# Patient Record
Sex: Female | Born: 1995 | Race: Black or African American | Hispanic: No | Marital: Single | Smoking: Never smoker
Health system: Southern US, Community
[De-identification: ages and names within clinical notes are randomized; demographics above are authoritative.]

## PROBLEM LIST (undated history)

## (undated) ENCOUNTER — Inpatient Hospital Stay (HOSPITAL_COMMUNITY): Payer: Self-pay

---

## 2020-02-02 ENCOUNTER — Encounter (HOSPITAL_COMMUNITY): Payer: Self-pay | Admitting: Emergency Medicine

## 2020-02-02 ENCOUNTER — Ambulatory Visit (HOSPITAL_COMMUNITY): Admission: EM | Admit: 2020-02-02 | Discharge: 2020-02-02 | Discharge status: Against Medical Advice | Payer: Self-pay

## 2020-02-02 ENCOUNTER — Other Ambulatory Visit: Payer: Self-pay

## 2020-02-02 ENCOUNTER — Emergency Department (HOSPITAL_COMMUNITY): Payer: 59

## 2020-02-02 ENCOUNTER — Emergency Department (HOSPITAL_COMMUNITY)
Admission: EM | Admit: 2020-02-02 | Discharge: 2020-02-02 | Disposition: A | Discharge status: Home / Self Care | Payer: 59 | Attending: Emergency Medicine | Admitting: Emergency Medicine

## 2020-02-02 DIAGNOSIS — R42 Dizziness and giddiness: Secondary | ICD-10-CM | POA: Diagnosis present

## 2020-02-02 LAB — CBC WITH DIFFERENTIAL/PLATELET
Abs Immature Granulocytes: 0.03 10*3/uL (ref 0.00–0.07)
Basophils Absolute: 0.1 10*3/uL (ref 0.0–0.1)
Basophils Relative: 1 %
Eosinophils Absolute: 0.4 10*3/uL (ref 0.0–0.5)
Eosinophils Relative: 4 %
HCT: 40.4 % (ref 36.0–46.0)
Hemoglobin: 12.1 g/dL (ref 12.0–15.0)
Immature Granulocytes: 0 %
Lymphocytes Relative: 28 %
Lymphs Abs: 3.5 10*3/uL (ref 0.7–4.0)
MCH: 23.3 pg — ABNORMAL LOW (ref 26.0–34.0)
MCHC: 30 g/dL (ref 30.0–36.0)
MCV: 77.7 fL — ABNORMAL LOW (ref 80.0–100.0)
Monocytes Absolute: 1 10*3/uL (ref 0.1–1.0)
Monocytes Relative: 8 %
Neutro Abs: 7.2 10*3/uL (ref 1.7–7.7)
Neutrophils Relative %: 59 %
Platelets: 369 10*3/uL (ref 150–400)
RBC: 5.2 MIL/uL — ABNORMAL HIGH (ref 3.87–5.11)
RDW: 18.7 % — ABNORMAL HIGH (ref 11.5–15.5)
WBC: 12.1 10*3/uL — ABNORMAL HIGH (ref 4.0–10.5)
nRBC: 0 % (ref 0.0–0.2)

## 2020-02-02 LAB — COMPREHENSIVE METABOLIC PANEL
ALT: 13 U/L (ref 0–44)
AST: 22 U/L (ref 15–41)
Albumin: 4.4 g/dL (ref 3.5–5.0)
Alkaline Phosphatase: 37 U/L — ABNORMAL LOW (ref 38–126)
Anion gap: 9 (ref 5–15)
BUN: 10 mg/dL (ref 6–20)
CO2: 24 mmol/L (ref 22–32)
Calcium: 9.9 mg/dL (ref 8.9–10.3)
Chloride: 105 mmol/L (ref 98–111)
Creatinine, Ser: 0.79 mg/dL (ref 0.44–1.00)
GFR calc Af Amer: >60 mL/min (ref >60)
GFR calc non Af Amer: >60 mL/min (ref >60)
Glucose, Bld: 86 mg/dL (ref 70–99)
Potassium: 4.2 mmol/L (ref 3.5–5.1)
Sodium: 138 mmol/L (ref 135–145)
Total Bilirubin: 0.6 mg/dL (ref 0.3–1.2)
Total Protein: 7.2 g/dL (ref 6.5–8.1)

## 2020-02-02 LAB — URINALYSIS, ROUTINE W REFLEX MICROSCOPIC
Bilirubin Urine: NEGATIVE
Glucose, UA: NEGATIVE mg/dL
Ketones, ur: NEGATIVE mg/dL
Leukocytes,Ua: NEGATIVE
Nitrite: POSITIVE — AB
Protein, ur: NEGATIVE mg/dL
Specific Gravity, Urine: 1.012 (ref 1.005–1.030)
pH: 7 (ref 5.0–8.0)

## 2020-02-02 LAB — I-STAT BETA HCG BLOOD, ED (MC, WL, AP ONLY): I-stat hCG, quantitative: <5 m[IU]/mL (ref <5)

## 2020-02-02 MED ORDER — MECLIZINE HCL 25 MG PO TABS
50.0000 mg | ORAL_TABLET | Freq: Once | ORAL | Status: AC
  Administered 2020-02-02: 50 mg via ORAL
  Filled 2020-02-02: qty 2

## 2020-02-02 MED ORDER — DIAZEPAM 5 MG PO TABS: 5.0000 mg | ORAL_TABLET | Freq: Two times a day (BID) | ORAL | 0 refills | Status: DC | PRN

## 2020-02-02 MED ORDER — MECLIZINE HCL 25 MG PO TABS: 25.0000 mg | ORAL_TABLET | Freq: Three times a day (TID) | ORAL | 0 refills | Status: DC | PRN

## 2020-02-02 MED ORDER — DIAZEPAM 5 MG PO TABS
5.0000 mg | ORAL_TABLET | Freq: Once | ORAL | Status: AC
  Administered 2020-02-02: 5 mg via ORAL
  Filled 2020-02-02: qty 1

## 2020-02-02 NOTE — ED Notes (Signed)
Discharge instructions discussed with pt. Pt verbalized understanding. Pt stable and ambulatory. No signature pad avilable 

## 2020-02-02 NOTE — ED Triage Notes (Signed)
Pt c/o dizziness for the past 2 weeks, no nausea or vomiting, fever or chills.

## 2020-02-02 NOTE — ED Notes (Signed)
Pt refused to wait for dc vitals

## 2020-02-02 NOTE — ED Provider Notes (Signed)
[facility] EMERGENCY DEPARTMENT Provider Note   CSN: [id number] Arrival date & time: 02/02/20  1307     History Chief Complaint  Patient presents with  . Dizziness    Olivia Joseph is a 24 y.o. female.  HPI      24 year old female with no significant medical history presents with concern for vertigo.  Reports that vertigo symptoms have been present over the last 2 weeks.  Describes sensation of room spinning and feeling off balance which comes and goes, and is specifically worse with position changes.  Reports severe symptoms when she goes from sitting to standing, and also notes that symptoms worsen when she moves her head or her eyes.  Denies numbness, weakness, or visual changes.  Reports that when the dizziness is severe, she has difficulty talking.  Reports that when she first goes to stand up and the dizziness is severe she feels she cannot walk, but if she waits for a little bit she can walk normally when she gets going.  Denies chest pain, shortness of breath, abdominal pain, fevers, cough.  She is not vaccinated against COVID-19.  Reports she has had headaches that she has woken up with over the last several weeks.  History reviewed. No pertinent past medical history.  There are no problems to display for this patient.   History reviewed. No pertinent surgical history.   OB History   No obstetric history on file.     No family history on file.  Social History   Tobacco Use  . Smoking status: Never Smoker  Substance Use Topics  . Alcohol use: Never  . Drug use: Never    Home Medications Prior to Admission medications   Medication Sig Start Date End Date Taking? Authorizing Provider  diazepam (VALIUM) 5 MG tablet Take 1 tablet (5 mg total) by mouth every 12 (twelve) hours as needed for anxiety (dizziness). 02/02/20   Alvira Monday, MD  meclizine (ANTIVERT) 25 MG tablet Take 1 tablet (25 mg total) by mouth 3 (three) times daily as needed for  dizziness. 02/02/20   Alvira Monday, MD    Allergies    Patient has no known allergies.  Review of Systems   Review of Systems  Constitutional: Negative for fever.  HENT: Positive for ear pain. Negative for sore throat.   Eyes: Negative for visual disturbance.  Respiratory: Negative for cough and shortness of breath.   Cardiovascular: Negative for chest pain.  Gastrointestinal: Negative for abdominal pain, nausea and vomiting.  Genitourinary: Negative for difficulty urinating.  Musculoskeletal: Negative for back pain and neck pain.  Skin: Negative for rash.  Neurological: Positive for dizziness and headaches. Negative for syncope, facial asymmetry, speech difficulty, weakness and numbness.    Physical Exam Updated Vital Signs BP 123/80 (BP Location: Right Arm)   Pulse 70   Temp 98.4 F (36.9 C) (Oral)   Resp 18   SpO2 96%   Physical Exam Constitutional:      General: She is not in acute distress.    Appearance: Normal appearance. She is not ill-appearing.  HENT:     Head: Normocephalic and atraumatic.  Eyes:     General: No visual field deficit.    Extraocular Movements: Extraocular movements intact.     Conjunctiva/sclera: Conjunctivae normal.     Pupils: Pupils are equal, round, and reactive to light.  Cardiovascular:     Rate and Rhythm: Normal rate and regular rhythm.     Pulses: Normal pulses.  Pulmonary:  Effort: Pulmonary effort is normal. No respiratory distress.  Musculoskeletal:        General: No swelling or tenderness.     Cervical back: Normal range of motion.  Skin:    General: Skin is warm and dry.     Findings: No erythema or rash.  Neurological:     General: No focal deficit present.     Mental Status: She is alert and oriented to person, place, and time.     GCS: GCS eye subscore is 4. GCS verbal subscore is 5. GCS motor subscore is 6.     Cranial Nerves: No cranial nerve deficit, dysarthria or facial asymmetry.     Sensory: No sensory  deficit.     Motor: No weakness or tremor.     Coordination: Romberg sign negative. Coordination normal. Finger-Nose-Finger Test normal.     Gait: Gait normal.     ED Results / Procedures / Treatments   Labs (all labs ordered are listed, but only abnormal results are displayed) Labs Reviewed  CBC WITH DIFFERENTIAL/PLATELET - Abnormal; Notable for the following components:      Result Value   WBC 12.1 (*)    RBC 5.20 (*)    MCV 77.7 (*)    MCH 23.3 (*)    RDW 18.7 (*)    All other components within normal limits  COMPREHENSIVE METABOLIC PANEL - Abnormal; Notable for the following components:   Alkaline Phosphatase 37 (*)    All other components within normal limits  URINALYSIS, ROUTINE W REFLEX MICROSCOPIC - Abnormal; Notable for the following components:   Hgb urine dipstick SMALL (*)    Nitrite POSITIVE (*)    Bacteria, UA RARE (*)    All other components within normal limits  I-STAT BETA HCG BLOOD, ED ([facility], [facility], [facility] ONLY)    EKG EKG Interpretation  Date/Time:  Friday February 02 2020 18:48:00 EDT Ventricular Rate:  71 PR Interval:  150 QRS Duration: 74 QT Interval:  392 QTC Calculation: 425 R Axis:   73 Text Interpretation: Normal sinus rhythm with sinus arrhythmia Normal ECG No previous ECGs available Confirmed by Alvira Monday ([id number]) on 02/02/2020 7:43:42 PM   Radiology CT Head Wo Contrast  Result Date: 02/02/2020 CLINICAL DATA:  Vertigo, peripheral EXAM: CT HEAD WITHOUT CONTRAST TECHNIQUE: Contiguous axial images were obtained from the base of the skull through the vertex without intravenous contrast. COMPARISON:  None. FINDINGS: Brain: No evidence of acute infarction, hemorrhage, hydrocephalus, extra-axial collection or mass lesion/mass effect. Vascular: No hyperdense vessel or unexpected calcification. Skull: Negative for fracture or focal lesion. Sinuses/Orbits: Paranasal sinuses and mastoid air cells are clear. The orbits are unremarkable. Other: None.  IMPRESSION: No acute intracranial abnormality. Electronically Signed   By: Tish Frederickson M.D.   On: 02/02/2020 22:14    Procedures Procedures (including critical care time)  Medications Ordered in ED Medications  meclizine (ANTIVERT) tablet 50 mg (50 mg Oral Given 02/02/20 1900)  diazepam (VALIUM) tablet 5 mg (5 mg Oral Given 02/02/20 2048)    ED Course  I have reviewed the triage vital signs and the nursing notes.  Pertinent labs & imaging results that were available during my care of the patient were reviewed by me and considered in my medical decision making (see chart for details).    MDM Rules/Calculators/A&P                          24 year old female with no significant medical  history presents with concern for vertigo.  Differential diagnosis for dizziness includes central causes such as stroke, intracranial bleed, mass and peripheral causes such as BPPV, meniere's disease, viral.  Vertigo is positional, patient has normal neurologic exam, including normal gait and coordination, and no risk factors of stroke and have low suspicion for CVA or other central cause of vertigo.  However, she did report morning headaches and given headaches and dizziness, obtained CT head to screen for other abnormalities which shows no significant findings.  Given meclizine and valium with significant improvement, able to ambulate without dizziness.  Given rx for same and recommend PCP follow up. Patient discharged in stable condition with understanding of reasons to return.   Final Clinical Impression(s) / ED Diagnoses Final diagnoses:  Vertigo    Rx / DC Orders ED Discharge Orders         Ordered    diazepam (VALIUM) 5 MG tablet  Every 12 hours PRN        02/02/20 2242    meclizine (ANTIVERT) 25 MG tablet  3 times daily PRN        02/02/20 2242           Alvira Monday, MD 02/03/20 0111

## 2021-09-26 IMAGING — CT CT HEAD W/O CM
4 series · 17 of 47 positions shown, 19 images · non-contrast
Comparison: None.

CLINICAL DATA: Vertigo, peripheral

EXAM:
CT HEAD WITHOUT CONTRAST
TECHNIQUE: Contiguous axial images were obtained from the base of the skull
through the vertex without intravenous contrast.

[Series 3: head without · axial · non-contrast · 0.43mm/px · z∈[-56,+64]mm · 7 of 33 slices shown, 9 images]
[im 5/33  brain]
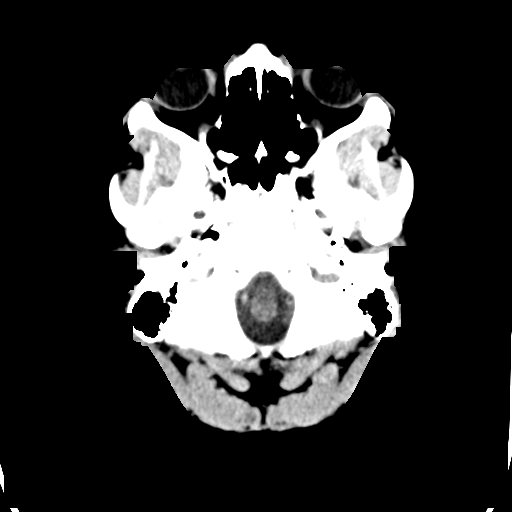
[im 5/33  bone]
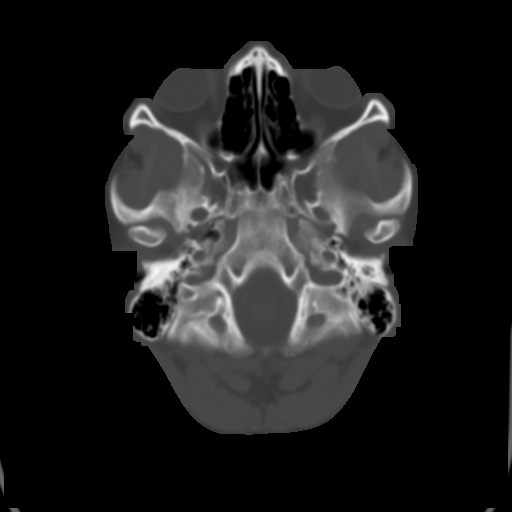
[im 9/33  brain]
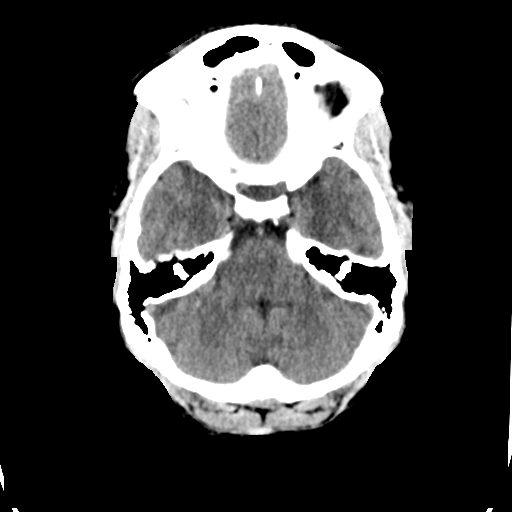
[im 13/33  brain]
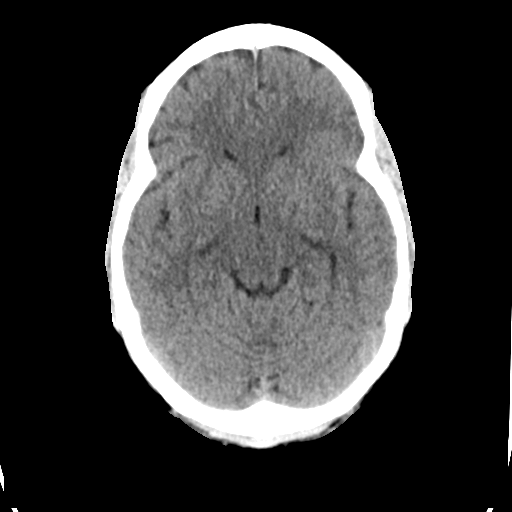
[im 17/33  brain]
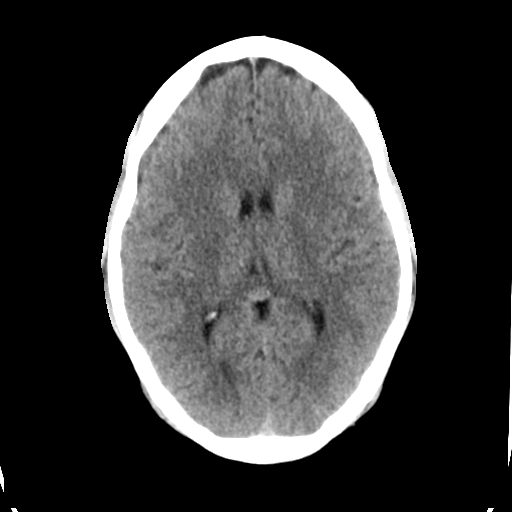
[im 21/33  brain]
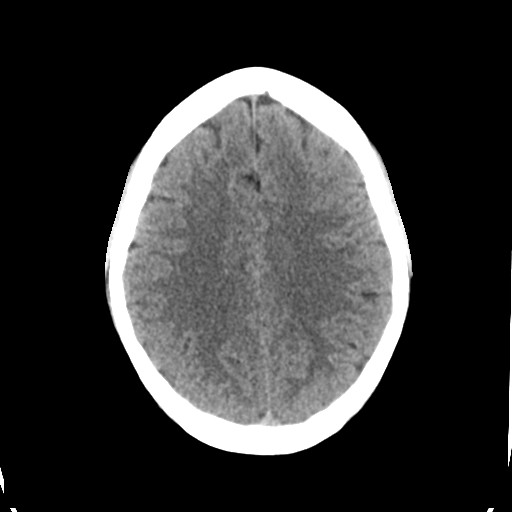
[im 21/33  bone]
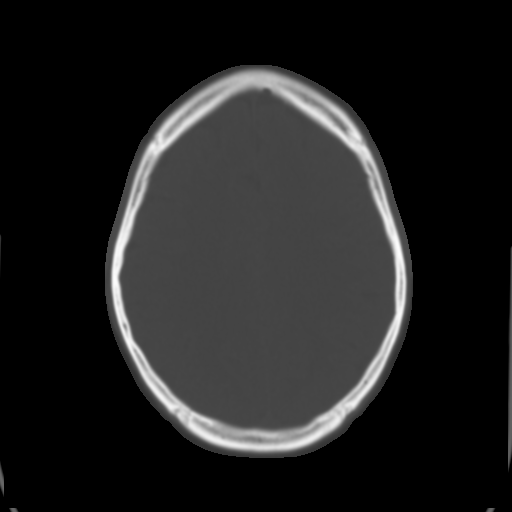
[im 25/33  brain]
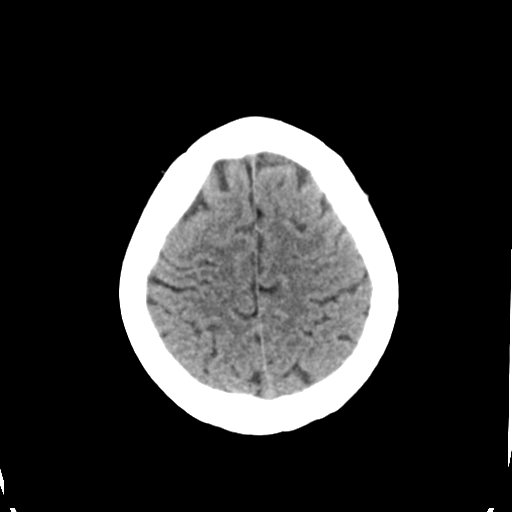
[im 29/33  brain]
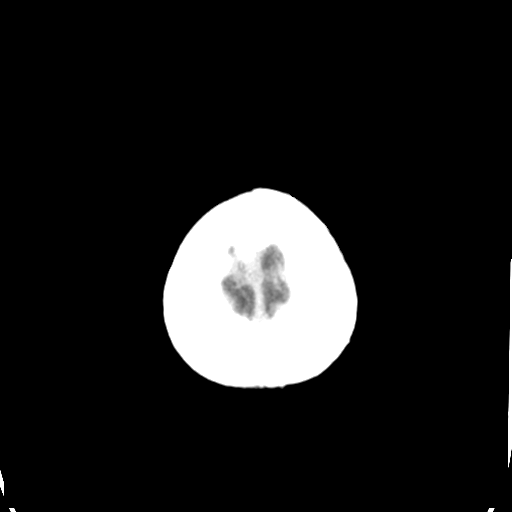

[Series 4: head bone · axial · 0.43mm/px · z∈[-60,-4]mm · 4 of 81 slices shown]
[im 9/81  bone]
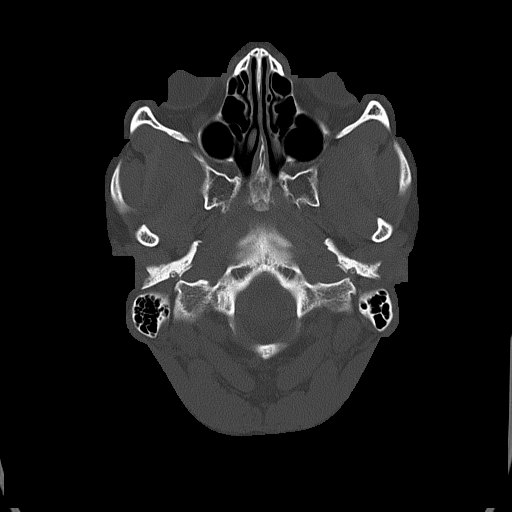
[im 17/81  bone]
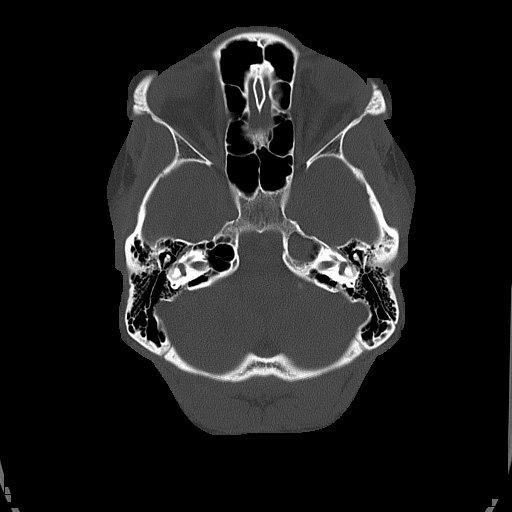
[im 25/81  bone]
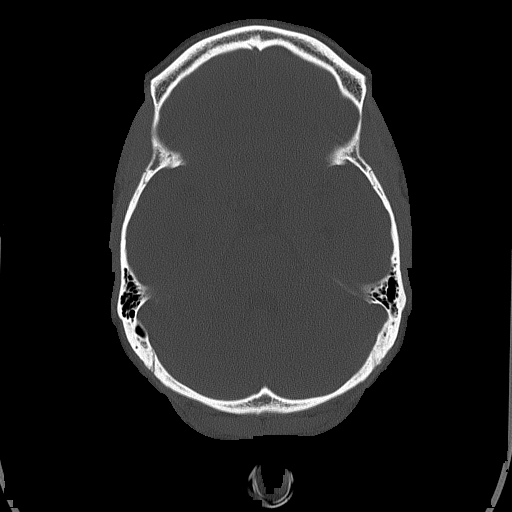
[im 37/81  bone]
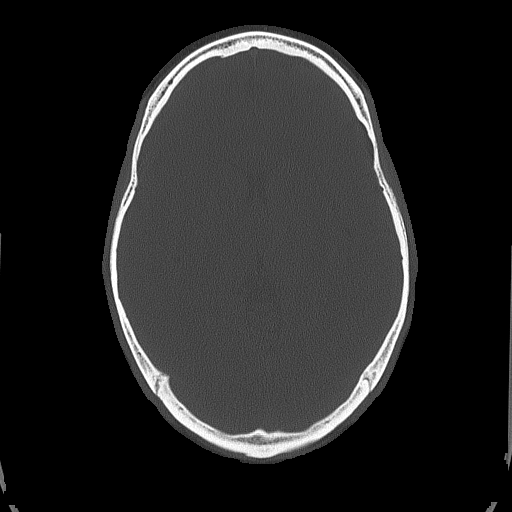

[Series 5: head without cor · coronal · non-contrast · 0.41mm/px · 3 of 73 slices shown]
[im 25/73  brain]
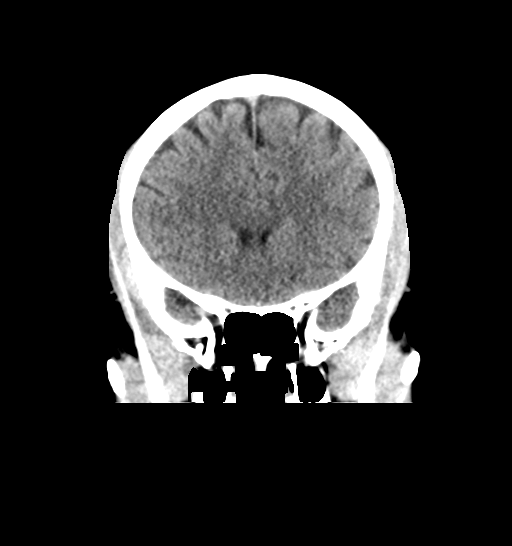
[im 33/73  brain]
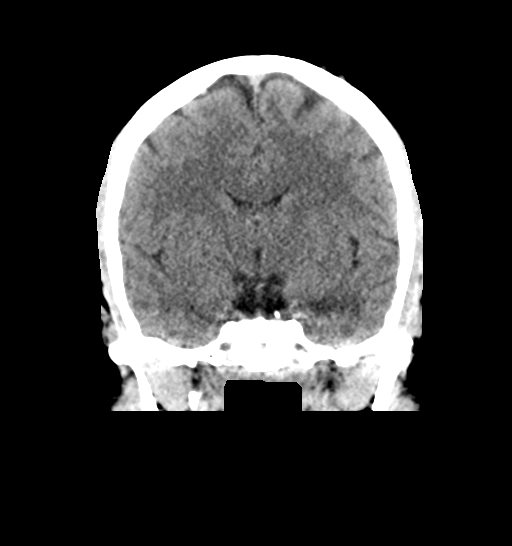
[im 41/73  brain]
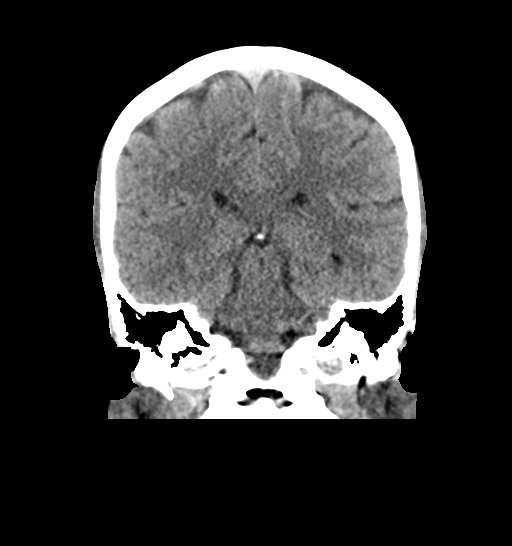

[Series 6: head without sag · sagittal · non-contrast · 0.36mm/px · 3 of 66 slices shown]
[im 22/66  brain]
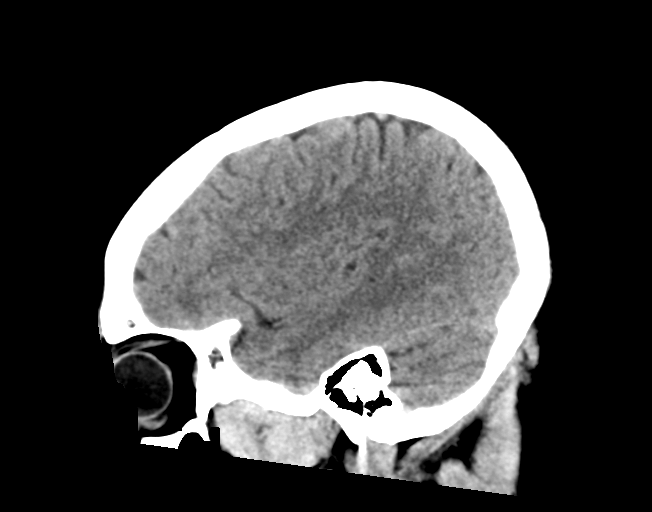
[im 33/66  brain]
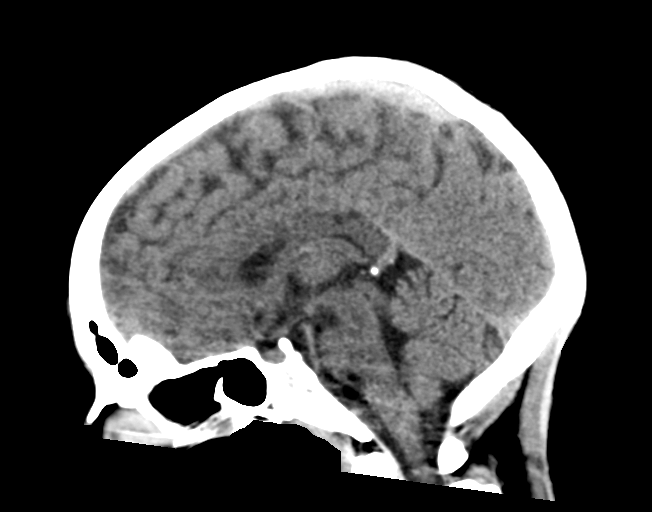
[im 44/66  brain]
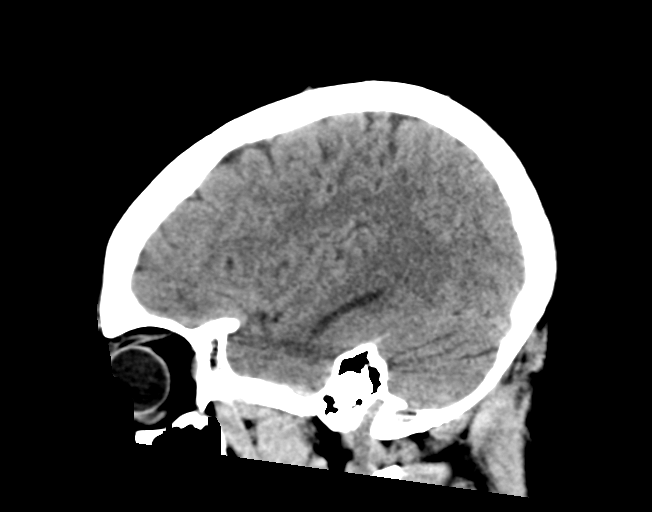

[17 of 47 positions shown; findings below may reference images not displayed]

FINDINGS: Brain: No evidence of acute infarction, hemorrhage, hydrocephalus,
extra-axial collection or mass lesion/mass effect.

Vascular: No hyperdense vessel or unexpected calcification.

Skull: Negative for fracture or focal lesion.

Sinuses/Orbits: Paranasal sinuses and mastoid air cells are clear.
The orbits are unremarkable.

Other: None.
IMPRESSION: No acute intracranial abnormality.

## 2023-10-29 ENCOUNTER — Inpatient Hospital Stay (HOSPITAL_COMMUNITY)

## 2023-10-29 ENCOUNTER — Other Ambulatory Visit: Payer: Self-pay

## 2023-10-29 ENCOUNTER — Encounter (HOSPITAL_COMMUNITY): Payer: Self-pay | Admitting: Obstetrics and Gynecology

## 2023-10-29 ENCOUNTER — Inpatient Hospital Stay (HOSPITAL_COMMUNITY)
Admission: AD | Admit: 2023-10-29 | Discharge: 2023-10-29 | Disposition: A | Discharge status: Home / Self Care | Attending: Obstetrics and Gynecology | Admitting: Obstetrics and Gynecology

## 2023-10-29 DIAGNOSIS — O219 Vomiting of pregnancy, unspecified: Secondary | ICD-10-CM

## 2023-10-29 DIAGNOSIS — D259 Leiomyoma of uterus, unspecified: Secondary | ICD-10-CM | POA: Insufficient documentation

## 2023-10-29 DIAGNOSIS — O26851 Spotting complicating pregnancy, first trimester: Secondary | ICD-10-CM | POA: Diagnosis present

## 2023-10-29 DIAGNOSIS — K5903 Drug induced constipation: Secondary | ICD-10-CM

## 2023-10-29 DIAGNOSIS — N939 Abnormal uterine and vaginal bleeding, unspecified: Secondary | ICD-10-CM

## 2023-10-29 DIAGNOSIS — O3411 Maternal care for benign tumor of corpus uteri, first trimester: Secondary | ICD-10-CM | POA: Insufficient documentation

## 2023-10-29 DIAGNOSIS — O26891 Other specified pregnancy related conditions, first trimester: Secondary | ICD-10-CM

## 2023-10-29 DIAGNOSIS — O208 Other hemorrhage in early pregnancy: Secondary | ICD-10-CM | POA: Insufficient documentation

## 2023-10-29 DIAGNOSIS — T450X5A Adverse effect of antiallergic and antiemetic drugs, initial encounter: Secondary | ICD-10-CM | POA: Diagnosis not present

## 2023-10-29 DIAGNOSIS — Z3490 Encounter for supervision of normal pregnancy, unspecified, unspecified trimester: Secondary | ICD-10-CM

## 2023-10-29 DIAGNOSIS — Z3A08 8 weeks gestation of pregnancy: Secondary | ICD-10-CM | POA: Diagnosis not present

## 2023-10-29 DIAGNOSIS — O99611 Diseases of the digestive system complicating pregnancy, first trimester: Secondary | ICD-10-CM | POA: Insufficient documentation

## 2023-10-29 LAB — URINALYSIS, ROUTINE W REFLEX MICROSCOPIC
Bilirubin Urine: NEGATIVE
Glucose, UA: NEGATIVE mg/dL
Hgb urine dipstick: NEGATIVE
Ketones, ur: 20 mg/dL — AB
Leukocytes,Ua: NEGATIVE
Nitrite: NEGATIVE
Protein, ur: 30 mg/dL — AB
Specific Gravity, Urine: 1.019 (ref 1.005–1.030)
pH: 6 (ref 5.0–8.0)

## 2023-10-29 LAB — COMPREHENSIVE METABOLIC PANEL WITH GFR
ALT: 30 U/L (ref 0–44)
AST: 27 U/L (ref 15–41)
Albumin: 4.2 g/dL (ref 3.5–5.0)
Alkaline Phosphatase: 43 U/L (ref 38–126)
Anion gap: 8 (ref 5–15)
BUN: <5 mg/dL — ABNORMAL LOW (ref 6–20)
CO2: 22 mmol/L (ref 22–32)
Calcium: 9.9 mg/dL (ref 8.9–10.3)
Chloride: 103 mmol/L (ref 98–111)
Creatinine, Ser: 0.7 mg/dL (ref 0.44–1.00)
GFR, Estimated: >60 mL/min (ref >60)
Glucose, Bld: 85 mg/dL (ref 70–99)
Potassium: 3.3 mmol/L — ABNORMAL LOW (ref 3.5–5.1)
Sodium: 133 mmol/L — ABNORMAL LOW (ref 135–145)
Total Bilirubin: 0.7 mg/dL (ref 0.0–1.2)
Total Protein: 7.6 g/dL (ref 6.5–8.1)

## 2023-10-29 LAB — CBC
HCT: 41.1 % (ref 36.0–46.0)
Hemoglobin: 13.5 g/dL (ref 12.0–15.0)
MCH: 24.7 pg — ABNORMAL LOW (ref 26.0–34.0)
MCHC: 32.8 g/dL (ref 30.0–36.0)
MCV: 75.1 fL — ABNORMAL LOW (ref 80.0–100.0)
Platelets: 408 10*3/uL — ABNORMAL HIGH (ref 150–400)
RBC: 5.47 MIL/uL — ABNORMAL HIGH (ref 3.87–5.11)
RDW: 17.6 % — ABNORMAL HIGH (ref 11.5–15.5)
WBC: 13.3 10*3/uL — ABNORMAL HIGH (ref 4.0–10.5)
nRBC: 0 % (ref 0.0–0.2)

## 2023-10-29 LAB — WET PREP, GENITAL
Clue Cells Wet Prep HPF POC: NONE SEEN
Sperm: NONE SEEN
Trich, Wet Prep: NONE SEEN
WBC, Wet Prep HPF POC: <10 (ref <10)
Yeast Wet Prep HPF POC: NONE SEEN

## 2023-10-29 LAB — POCT PREGNANCY, URINE: Preg Test, Ur: POSITIVE — AB

## 2023-10-29 LAB — ABO/RH
ABO/RH(D): O NEG
Antibody Screen: NEGATIVE

## 2023-10-29 LAB — HCG, QUANTITATIVE, PREGNANCY: hCG, Beta Chain, Quant, S: 239826 m[IU]/mL — ABNORMAL HIGH (ref <5)

## 2023-10-29 MED ORDER — PROMETHAZINE HCL 12.5 MG PO TABS: 12.5000 mg | ORAL_TABLET | Freq: Four times a day (QID) | ORAL | 0 refills | Status: AC | PRN

## 2023-10-29 MED ORDER — DOXYLAMINE-PYRIDOXINE 10-10 MG PO TBEC: 2.0000 | DELAYED_RELEASE_TABLET | Freq: Every day | ORAL | 0 refills | Status: AC

## 2023-10-29 MED ORDER — POLYETHYLENE GLYCOL 3350 17 GM/SCOOP PO POWD: 17.0000 g | Freq: Every day | ORAL | 1 refills | Status: AC | PRN

## 2023-10-29 MED ORDER — SCOPOLAMINE 1 MG/3DAYS TD PT72: 1.0000 | MEDICATED_PATCH | TRANSDERMAL | 0 refills | Status: AC

## 2023-10-29 NOTE — Discharge Instructions (Signed)
FOR NAUSEA AND VOMITING: You can buy Unisom (Doxylamine) 25mg  tablets and Vitamin B6 (usually sold in 100mg  tablets) daily -- take half a tablet of Unisom (12.5mg  dose) and a tablet of Vitamin B6 every night. This would be in place of Diclegis if Diclegis is not covered by your insurance.  **The Unisom may make you drowsy! Please do not take this if you are going to be operating heavy machinery**

## 2023-10-29 NOTE — MAU Note (Signed)
 Olivia Joseph is a 28 y.o. at Unknown here in MAU reporting: she's been spotting for one week and has intermittent cramping.  Also states hasn't been able to keep anything down for approximately one week.  Reports has taken someone else's Zofran and it's helped, but doesn't take regularly.   Admits smokes marijuana regularly but stopped 1.5 weeks ago   LMP: 08/31/2023 Onset of complaint: one week Pain score: 7 Vitals:   10/29/23 1230  BP: (!) 141/68  Pulse: 90  Resp: 18  Temp: 97.7 F (36.5 C)  SpO2: 98%     FHT: NA  Lab orders placed from triage: UPT

## 2023-10-29 NOTE — MAU Provider Note (Addendum)
 History     CSN: [id number]  Arrival date and time: 10/29/23 1212   Event Date/Time   First Provider Initiated Contact with Patient 10/29/2023 12:29 PM   Chief Complaint  Patient presents with   Emesis   Nausea   Spotting   Cramping    HPI  Olivia Joseph is a 28 y.o. G1P0 at [redacted]w[redacted]d who presents to the MAU for cramping, vaginal spotting, nausea/vomiting, and constipation. Patient recently moved here from [place] . Had positive pregnancy test around [redacted] weeks gestation, had IUP, but no FP or YS at that time per her description. Was told to establish prenatal care, take B6 and start PNV. She initially did not desire to keep pregnancy, but now plans to keep pregnancy. Has good family support in the [place]  area. Reports since moving here has been experiencing more nausea and vomiting. Unable to keep fluids down at all. Decreased urine output. Had been taking Zofran 4mg  PRN. Also started having increased spotting and cramping, specifically of left lower quadrant. No frank vaginal bleeding, fevers, chills, dysuria, abnormal vaginal discharge or vulvovaginal discomfort.  No past medical history on file.  No past surgical history on file.  No family history on file.  Social History   Tobacco Use   Smoking status: Never  Substance Use Topics   Alcohol use: Never   Drug use: Never    Allergies: No Known Allergies  Medications Prior to Admission  Medication Sig Dispense Refill Last Dose/Taking   diazepam  (VALIUM ) 5 MG tablet Take 1 tablet (5 mg total) by mouth every 12 (twelve) hours as needed for anxiety (dizziness). 10 tablet 0    meclizine  (ANTIVERT ) 25 MG tablet Take 1 tablet (25 mg total) by mouth 3 (three) times daily as needed for dizziness. 30 tablet 0     ROS reviewed and pertinent positives and negatives as documented in HPI.  Physical Exam   Blood pressure (!) 141/68, pulse 90, temperature 97.7 F (36.5 C), temperature source Oral, resp. rate 18, height 5' 5 (1.651  m), weight 82.7 kg, last menstrual period 08/31/2023, SpO2 98%.  Physical Exam Constitutional:      General: She is not in acute distress.    Appearance: Normal appearance. She is not ill-appearing.  HENT:     Head: Normocephalic and atraumatic.   Cardiovascular:     Rate and Rhythm: Normal rate.  Pulmonary:     Effort: Pulmonary effort is normal.     Breath sounds: Normal breath sounds.  Abdominal:     Palpations: Abdomen is soft.     Tenderness: There is no abdominal tenderness. There is no guarding.   Musculoskeletal:        General: Normal range of motion.   Skin:    General: Skin is warm and dry.     Findings: No rash.   Neurological:     General: No focal deficit present.     Mental Status: She is alert and oriented to person, place, and time.     MAU Course  Procedures  MDM 28 y.o. G1P0 at [redacted]w[redacted]d presenting for VB and cramping as well as n/v and constipation of pregnancy. She is hemodynamically stable. Her labs are notable for Rh negative status, stable HgB, and overall hemoconcentration. Urine also confirms suspected dehydration. Patient also notably taking Zofran. Discussed alternative options to Zofran -- will start Diclegis at bedtime, Phenergan PRN, and Scopolamine q72h. Anticipate with discontinuation of Zofran and improved nausea/vomiting, will be able to stay adequately hydrated. She  also has IUP with [facility]. Discussed diagnosis with patient, return precautions. Patient plans to establish prenatal care here, list of providers in the area given. Stable for d/c.  Assessment and Plan     ICD-10-CM   1. Intrauterine pregnancy  Z34.90     2. Rh negative state in antepartum period, first trimester  O26.891    Z67.91     3. Nausea/vomiting in pregnancy  O21.9     4. Drug-induced constipation  K59.03     5. Vaginal spotting  N93.9     Rx for Scopolamine, Phenergan, Diclegis, and Miralax given Has IUP Rh negative, though no indication for RhoGAM given first  trimester pregnancy Rec pelvic rest Given list of safe meds in preg and OB providers in the area Stable for d/c   Melanie Spires, MD OB Fellow, Faculty Practice [facility], [facility] for [facility]  10/29/2023, 4:28 PM

## 2023-11-01 LAB — GC/CHLAMYDIA PROBE AMP (~~LOC~~) NOT AT ARMC
Chlamydia: NEGATIVE
Comment: NEGATIVE
Comment: NORMAL
Neisseria Gonorrhea: NEGATIVE

## 2023-11-22 ENCOUNTER — Inpatient Hospital Stay (HOSPITAL_COMMUNITY)
Admission: AD | Admit: 2023-11-22 | Discharge: 2023-11-22 | Disposition: A | Discharge status: Home / Self Care | Attending: Obstetrics and Gynecology | Admitting: Obstetrics and Gynecology

## 2023-11-22 DIAGNOSIS — O26891 Other specified pregnancy related conditions, first trimester: Secondary | ICD-10-CM

## 2023-11-22 DIAGNOSIS — O23591 Infection of other part of genital tract in pregnancy, first trimester: Secondary | ICD-10-CM | POA: Diagnosis not present

## 2023-11-22 DIAGNOSIS — Z3A11 11 weeks gestation of pregnancy: Secondary | ICD-10-CM

## 2023-11-22 DIAGNOSIS — N390 Urinary tract infection, site not specified: Secondary | ICD-10-CM

## 2023-11-22 DIAGNOSIS — B9689 Other specified bacterial agents as the cause of diseases classified elsewhere: Secondary | ICD-10-CM

## 2023-11-22 DIAGNOSIS — R109 Unspecified abdominal pain: Secondary | ICD-10-CM | POA: Diagnosis not present

## 2023-11-22 DIAGNOSIS — O2341 Unspecified infection of urinary tract in pregnancy, first trimester: Secondary | ICD-10-CM | POA: Insufficient documentation

## 2023-11-22 DIAGNOSIS — N760 Acute vaginitis: Secondary | ICD-10-CM

## 2023-11-22 DIAGNOSIS — R319 Hematuria, unspecified: Secondary | ICD-10-CM

## 2023-11-22 LAB — URINALYSIS, ROUTINE W REFLEX MICROSCOPIC
Bilirubin Urine: NEGATIVE
Glucose, UA: 50 mg/dL — AB
Hgb urine dipstick: NEGATIVE
Ketones, ur: 20 mg/dL — AB
Nitrite: POSITIVE — AB
Protein, ur: 100 mg/dL — AB
Specific Gravity, Urine: 1.025 (ref 1.005–1.030)
pH: 6 (ref 5.0–8.0)

## 2023-11-22 LAB — WET PREP, GENITAL
Sperm: NONE SEEN
Trich, Wet Prep: NONE SEEN
WBC, Wet Prep HPF POC: <10 (ref <10)
Yeast Wet Prep HPF POC: NONE SEEN

## 2023-11-22 MED ORDER — CEFADROXIL 500 MG PO CAPS: 500.0000 mg | ORAL_CAPSULE | Freq: Two times a day (BID) | ORAL | 0 refills | Status: AC

## 2023-11-22 MED ORDER — METRONIDAZOLE 500 MG PO TABS: 500.0000 mg | ORAL_TABLET | Freq: Two times a day (BID) | ORAL | 0 refills | Status: AC

## 2023-11-22 NOTE — Discharge Instructions (Signed)

## 2023-11-22 NOTE — MAU Provider Note (Signed)
 History     CSN: [id number]  Arrival date and time: 11/22/23 1300   Event Date/Time   First Provider Initiated Contact with Patient 11/22/23 1533      Chief Complaint  Patient presents with   Abdominal Pain   Olivia Joseph , a  28 y.o. G1P0 at [redacted]w[redacted]d presents to MAU with lower abdominal cramping. Patient states that 3 days ago she started having a thin white foul smelling vaginal discharge. She states that she started wearing a pad and it became worse. She also reports having difficulty urinating. She states that when she goes she feels like its going to be a whole lot but states that its only a few drops. She also reports the most pain with urination. She denies attempting to relieve symptoms as she was unsure of what she could take. She denies vaginal bleeding, or back pain.          OB History     Gravida  1   Para      Term      Preterm      AB      Living         SAB      IAB      Ectopic      Multiple      Live Births              No past medical history on file.  No past surgical history on file.  No family history on file.  Social History   Tobacco Use   Smoking status: Never  Substance Use Topics   Alcohol use: Never   Drug use: Never    Allergies: No Known Allergies  Medications Prior to Admission  Medication Sig Dispense Refill Last Dose/Taking   Doxylamine -Pyridoxine  10-10 MG TBEC Take 2 tablets by mouth at bedtime. 60 tablet 0    polyethylene glycol powder (GLYCOLAX /MIRALAX ) 17 GM/SCOOP powder Take 17 g by mouth daily as needed. 510 g 1    promethazine  (PHENERGAN ) 12.5 MG tablet Take 1 tablet (12.5 mg total) by mouth every 6 (six) hours as needed for nausea or vomiting. 30 tablet 0    scopolamine  (TRANSDERM-SCOP) 1 MG/3DAYS Place 1 patch (1.5 mg total) onto the skin every 3 (three) days. 10 patch 0     Review of Systems  Constitutional:  Negative for chills, fatigue and fever.  Eyes:  Negative for pain and visual  disturbance.  Respiratory:  Negative for apnea, shortness of breath and wheezing.   Cardiovascular:  Negative for chest pain and palpitations.  Gastrointestinal:  Negative for abdominal pain, constipation, diarrhea, nausea and vomiting.  Genitourinary:  Positive for decreased urine volume, difficulty urinating, dysuria, pelvic pain, urgency and vaginal discharge. Negative for vaginal bleeding and vaginal pain.  Musculoskeletal:  Negative for back pain.  Neurological:  Negative for seizures, weakness and headaches.  Psychiatric/Behavioral:  Negative for suicidal ideas.    Physical Exam   Blood pressure 125/78, pulse 95, temperature 98.4 F (36.9 C), resp. rate 18, height 5' 5 (1.651 m), weight 81.2 kg, last menstrual period 08/31/2023.  Physical Exam Vitals and nursing note reviewed.  Constitutional:      General: She is not in acute distress.    Appearance: Normal appearance.  HENT:     Head: Normocephalic.  Pulmonary:     Effort: Pulmonary effort is normal.  Abdominal:     Palpations: Abdomen is soft.     Tenderness: There is no abdominal tenderness.  There is guarding.  Musculoskeletal:     Cervical back: Normal range of motion.  Skin:    General: Skin is warm and dry.  Neurological:     Mental Status: She is alert and oriented to person, place, and time.  Psychiatric:        Mood and Affect: Mood normal.     MAU Course  Procedures Orders Placed This Encounter  Procedures   Wet prep, genital   Culture, OB Urine   Urinalysis, Routine w reflex microscopic -Urine, Clean Catch   Discharge patient Discharge disposition: 01-Home or Self Care; Discharge patient date: 11/22/2023   Results for orders placed or performed during the hospital encounter of 11/22/23 (from the past 24 hours)  Wet prep, genital     Status: Abnormal   Collection Time: 11/22/23  2:35 PM   Specimen: PATH Cytology Cervicovaginal Ancillary Only  Result Value Ref Range   Yeast Wet Prep HPF POC NONE SEEN  NONE SEEN   Trich, Wet Prep NONE SEEN NONE SEEN   Clue Cells Wet Prep HPF POC PRESENT (A) NONE SEEN   WBC, Wet Prep HPF POC <10 <10   Sperm NONE SEEN   Urinalysis, Routine w reflex microscopic -Urine, Clean Catch     Status: Abnormal   Collection Time: 11/22/23  2:36 PM  Result Value Ref Range   Color, Urine AMBER (A) YELLOW   APPearance CLOUDY (A) CLEAR   Specific Gravity, Urine 1.025 1.005 - 1.030   pH 6.0 5.0 - 8.0   Glucose, UA 50 (A) NEGATIVE mg/dL   Hgb urine dipstick NEGATIVE NEGATIVE   Bilirubin Urine NEGATIVE NEGATIVE   Ketones, ur 20 (A) NEGATIVE mg/dL   Protein, ur 899 (A) NEGATIVE mg/dL   Nitrite POSITIVE (A) NEGATIVE   Leukocytes,Ua MODERATE (A) NEGATIVE   RBC / HPF 0-5 0 - 5 RBC/hpf   WBC, UA 11-20 0 - 5 WBC/hpf   Bacteria, UA MANY (A) NONE SEEN   Squamous Epithelial / HPF 0-5 0 - 5 /HPF   Mucus PRESENT     MDM - Wet prep positive for clue cells and given foul odor likely related to BV.  - UA also notable for a UTI. Reflexed to culture.  - plan for discharge.   Assessment and Plan   1. Urinary tract infection with hematuria, site unspecified   2. BV (bacterial vaginosis)   3. Abdominal pain, unspecified abdominal location   4. [redacted] weeks gestation of pregnancy    - Reviewed that abdominal pain is likely related to UTI symptoms.  - Rx for [facility] sent to outpatient pharmacy for pick up.  - Also discussed signs and symptoms of BV. Reviewed expectations and recommended to start medication at the completion of UTI meds. Patient verbalized understanding. - Rx sent for Metronidazole    - Reviewed worsening signs and symptoms and when to return to MAU.  - Patient discharged home in stable condition and may return to MAU As needed   Claris CHRISTELLA Cedar, MSN CNM  11/22/2023, 3:33 PM

## 2023-11-22 NOTE — MAU Note (Signed)
 Olivia Joseph is a 28 y.o. at [redacted]w[redacted]d here in MAU reporting: c/o pain in lower abd  x 5 days. Denies any vag bleeding reports clear vag discharge that has odor. Reports pain with urination and small amounts when she voids.   LMP:  Onset of complaint: 5 days Pain score: 7  Vitals:   11/22/23 1432  BP: 125/78  Pulse: 95  Resp: 18  Temp: 98.4 F (36.9 C)     FHT: n/a  Lab orders placed from triage: u/a, wet prep, gc

## 2023-11-23 LAB — CULTURE, OB URINE: Culture: <10000 — AB

## 2023-11-23 LAB — GC/CHLAMYDIA PROBE AMP (~~LOC~~) NOT AT ARMC
Chlamydia: NEGATIVE
Comment: NEGATIVE
Comment: NORMAL
Neisseria Gonorrhea: NEGATIVE
